# Patient Record
Sex: Female | Born: 1935 | Race: Black or African American | Hispanic: No | Smoking: Never smoker
Health system: Southern US, Community
[De-identification: ages and names within clinical notes are randomized; demographics above are authoritative.]

## PROBLEM LIST (undated history)

## (undated) DIAGNOSIS — K219 Gastro-esophageal reflux disease without esophagitis: Secondary | ICD-10-CM

## (undated) DIAGNOSIS — F329 Major depressive disorder, single episode, unspecified: Secondary | ICD-10-CM

## (undated) DIAGNOSIS — E079 Disorder of thyroid, unspecified: Secondary | ICD-10-CM

## (undated) DIAGNOSIS — I499 Cardiac arrhythmia, unspecified: Secondary | ICD-10-CM

## (undated) DIAGNOSIS — F32A Depression, unspecified: Secondary | ICD-10-CM

## (undated) DIAGNOSIS — M8580 Other specified disorders of bone density and structure, unspecified site: Secondary | ICD-10-CM

## (undated) HISTORY — PX: THYROIDECTOMY: SHX17

## (undated) HISTORY — DX: Major depressive disorder, single episode, unspecified: F32.9

## (undated) HISTORY — DX: Depression, unspecified: F32.A

## (undated) HISTORY — DX: Disorder of thyroid, unspecified: E07.9

## (undated) HISTORY — DX: Other specified disorders of bone density and structure, unspecified site: M85.80

## (undated) HISTORY — DX: Gastro-esophageal reflux disease without esophagitis: K21.9

## (undated) HISTORY — DX: Cardiac arrhythmia, unspecified: I49.9

## (undated) HISTORY — PX: OTHER SURGICAL HISTORY: SHX169

---

## 2000-11-02 ENCOUNTER — Ambulatory Visit (HOSPITAL_COMMUNITY): Admission: RE | Admit: 2000-11-02 | Discharge: 2000-11-02 | Payer: Self-pay | Admitting: Obstetrics and Gynecology

## 2000-11-02 ENCOUNTER — Encounter: Payer: Self-pay | Admitting: Internal Medicine

## 2004-03-24 ENCOUNTER — Inpatient Hospital Stay (HOSPITAL_COMMUNITY): Admission: RE | Admit: 2004-03-24 | Discharge: 2004-03-28 | Payer: Self-pay | Admitting: Orthopedic Surgery

## 2004-03-28 ENCOUNTER — Inpatient Hospital Stay (HOSPITAL_COMMUNITY)
Admission: RE | Admit: 2004-03-28 | Discharge: 2004-04-03 | Payer: Self-pay | Admitting: Physical Medicine & Rehabilitation

## 2004-06-25 ENCOUNTER — Inpatient Hospital Stay (HOSPITAL_COMMUNITY): Admission: RE | Admit: 2004-06-25 | Discharge: 2004-06-28 | Payer: Self-pay | Admitting: Orthopedic Surgery

## 2004-06-25 ENCOUNTER — Ambulatory Visit: Payer: Self-pay | Admitting: Physical Medicine & Rehabilitation

## 2004-06-28 ENCOUNTER — Inpatient Hospital Stay
Admission: RE | Admit: 2004-06-28 | Discharge: 2004-07-02 | Payer: Self-pay | Admitting: Physical Medicine & Rehabilitation

## 2004-06-28 ENCOUNTER — Ambulatory Visit: Payer: Self-pay | Admitting: Physical Medicine & Rehabilitation

## 2005-07-17 ENCOUNTER — Emergency Department (HOSPITAL_COMMUNITY): Admission: EM | Admit: 2005-07-17 | Discharge: 2005-07-17 | Payer: Self-pay | Admitting: Emergency Medicine

## 2006-04-05 ENCOUNTER — Ambulatory Visit (HOSPITAL_COMMUNITY): Admission: RE | Admit: 2006-04-05 | Discharge: 2006-04-05 | Payer: Self-pay | Admitting: Internal Medicine

## 2006-12-20 ENCOUNTER — Ambulatory Visit (HOSPITAL_COMMUNITY): Admission: RE | Admit: 2006-12-20 | Discharge: 2006-12-20 | Payer: Self-pay | Admitting: Internal Medicine

## 2007-07-30 ENCOUNTER — Emergency Department (HOSPITAL_COMMUNITY): Admission: EM | Admit: 2007-07-30 | Discharge: 2007-07-30 | Payer: Self-pay | Admitting: *Deleted

## 2007-09-15 ENCOUNTER — Ambulatory Visit (HOSPITAL_COMMUNITY): Admission: RE | Admit: 2007-09-15 | Discharge: 2007-09-15 | Payer: Self-pay | Admitting: Internal Medicine

## 2008-01-03 ENCOUNTER — Ambulatory Visit (HOSPITAL_COMMUNITY): Admission: RE | Admit: 2008-01-03 | Discharge: 2008-01-03 | Payer: Self-pay | Admitting: Internal Medicine

## 2008-01-19 ENCOUNTER — Encounter: Admission: RE | Admit: 2008-01-19 | Discharge: 2008-01-19 | Payer: Self-pay | Admitting: Gastroenterology

## 2010-09-21 ENCOUNTER — Encounter: Payer: Self-pay | Admitting: Internal Medicine

## 2010-12-23 ENCOUNTER — Other Ambulatory Visit: Payer: Self-pay | Admitting: Geriatric Medicine

## 2010-12-23 DIAGNOSIS — Z1231 Encounter for screening mammogram for malignant neoplasm of breast: Secondary | ICD-10-CM

## 2010-12-30 ENCOUNTER — Ambulatory Visit
Admission: RE | Admit: 2010-12-30 | Discharge: 2010-12-30 | Disposition: A | Discharge status: Home / Self Care | Payer: MEDICARE | Source: Ambulatory Visit | Attending: Geriatric Medicine | Admitting: Geriatric Medicine

## 2010-12-30 DIAGNOSIS — Z1231 Encounter for screening mammogram for malignant neoplasm of breast: Secondary | ICD-10-CM

## 2011-01-16 NOTE — Discharge Summary (Signed)
NAME:  Heather Ball, Heather Ball                      ACCOUNT NO.:  [id number]   MEDICAL RECORD NO.:  [id number]                   PATIENT TYPE:  IPS   LOCATION:  4033                                 FACILITY:  [facility]   PHYSICIAN:  Ellwood Dense, M.D.                DATE OF BIRTH:  March 07, 1932   DATE OF ADMISSION:  03/28/2004  DATE OF DISCHARGE:  04/03/2004                                 DISCHARGE SUMMARY   DISCHARGE DIAGNOSES:  1. Right total knee replacement.  2. Postoperative anemia.  3. Proteus urinary tract infection.   HISTORY OF PRESENT ILLNESS:  Heather Ball is a 77 year old female with a  history of GERD, hypothyroidism, end stage right knee, elected to undergo  right total knee replacement by Dr. Eulah Pont on March 24, 2004.  Post-op is  weightbearing as tolerated on Coumadin for DVT prophylaxis.  Chest x-ray  done showed some bronchitic changes and nebulizers were ordered.  The  patient on iron supplements for post-op anemia.  Physical therapy initiated  and patient is progressing along nicely.  She is independent supervision for  transfers, supervision for ambulating 80-130 feet.  __________  consulted  for progressive independence __________  .   PAST MEDICAL HISTORY:  1. Partial thyroidectomy.  2. Hypothyroidism.  3. Left knee scope.  4. Right knee DJD.  5. Bladder tack, 1988.  6. GERD.  7. Insomnia.  8. Bilateral adrenal enlargement.  9. Nocturia.   ALLERGIES:  PENICILLIN.   SOCIAL HISTORY:  The patient lives with her daughter in a one-level home  with three steps at entry, was independent with cane prior to admission.  She does not use any tobacco.  Smokes approximately one pack per day.   HOSPITAL COURSE:  Heather Ball was admitted to rehab, on March 28, 2004, for inpatient therapies to consist of PT OT daily.  Past admission she  was maintained on Coumadin for DVT prophylaxis.  Subcu heparin was continued  initially as INR was sub-therapeutic at 1.5.  Labs  done past admission  showed post-op anemia to be improving with a hemoglobin of 10.8, hematocrit  31.2, white count 6.2, platelets 420.  Sodium 137, potassium 3.9, chloride  103, CO2 28, BUN 20, creatinine 0.7, glucose of 112.  Alkaline phosphatase  slightly high at 92.  SGOT 51, SGPT 58.  The patient's UA/UC&S was checked  secondary to low-grade fevers, this grew out greater than 100,000 colonies  of Proteus.  The patient was started on Cipro for this and is to continue  this for seven total days of antibiotic therapy.  The patient's knee  incision has been healing well without any signs or symptoms of infection.  No drainage, no erythema, noted.  Staples remain intact and are to be D/C'd  on April 07, 2004, post-op day #15.  A home health nurse has been arranged  to draw pro times.  The Coumadin to continue through April 20, 2004.  The  patient has had issues with poor pain control initially.  She was started on  OxyContin CR with more consistent pain relief.  During her stay in rehab,  Heather Ball has progressed along well.  Currently, she is modified  independent for ADL knees, modified independent for toileting, modified  independent for kitchen activities.  She is at independent level for  transfers, modified independent for ambulating 150 feet with a rolling-  walker.  Knee flexion is currently at 72 degrees.  Further followup  therapies to include home health PT, OT by [facility] past discharge  on April 03, 2004.  The patient is discharged to home.   DISCHARGE MEDICATIONS:  1. Coumadin 3 mg two p.o. q.p.m.  2. Os-Cal plus D t.i.d.  3. Levothyroxine 100 mcg a day.  4. Trinsicon one p.o. b.i.d.  5. Senokot S two p.o. b.i.d.  6. OxyContin CR 10 mg q.12h. x 1 week, then one per day till gone.  7. OxyIR 5-10 mg q.4-6h. p.r.n. pain.  8. Robaxin 500 mg q.i.d. p.r.n. spasm.  9. Cipro 250 mg b.i.d.   PAIN MANAGEMENT:  May use Tylenol additionally for pain.   ACTIVITY:  Use  walker.   WOUND CARE:  Keep area clean and dry.   SPECIAL INSTRUCTIONS:  No alcohol, no smoking, no driving, no aspirin or  aspirin-containing products.   FOLLOW UP:  1. The patient to follow up with Dr. Eulah Pont and Dr. Chestine Spore in the next 2-3     weeks.  2. Follow up with Dr. Thomasena Edis as needed.      Greg Cutter, P.A.                    Ellwood Dense, M.D.    PP/MEDQ  D:  04/03/2004  T:  04/04/2004  Job:  [id number]   cc:   Chestine Spore, Dr.   Loreta Ave, M.D.  [address]  [place] [address]  Fax: [phone]

## 2011-01-16 NOTE — Op Note (Signed)
Heather Ball, Heather Ball            ACCOUNT NO.:  [id number]   MEDICAL RECORD NO.:  [id number]          PATIENT TYPE:  INP   LOCATION:  5017                         FACILITY:  [facility]   PHYSICIAN:  Loreta Ave, M.D. DATE OF BIRTH:  11-19-1931   DATE OF PROCEDURE:  06/25/2004  DATE OF DISCHARGE:                                 OPERATIVE REPORT   PREOPERATIVE DIAGNOSIS:  End-stage degenerative arthritis left knee with  marked arthrofibrosis and varus alignment and marked degenerative spurring.   POSTOPERATIVE DIAGNOSIS:  End-stage degenerative arthritis left knee with  marked arthrofibrosis and varus alignment and marked degenerative spurring.   PROCEDURE:  Left total knee replacement [employer] prosthesis.  Soft tissue  balancing.  Cemented #5 posterior stabilized femoral component.  Cemented #5  tibial tray with a 12 mm posterior stabilized Flex polyethylene insert.  Cemented recess 26 mm patella component.   SURGEON:  Loreta Ave, M.D.   ASSISTANT:  Cecil Cranker, PA.   ANESTHESIA:  General anesthesia.   ESTIMATED BLOOD LOSS:  Minimal.   TOURNIQUET TIME:  One hour and 30 minutes.   SPECIMENS:  Excised bone and soft tissue.   CULTURES:  None.   COMPLICATIONS:  None.   DRESSING:  Soft compressive.   DRAIN:  Hemovac x2.   DESCRIPTION OF PROCEDURE:  The patient was brought to the operating room and  after adequate anesthesia had been obtained, left knee examined.  Full  extension, flexion to about 70 degrees with an abrupt end point.  Varus  alignment barely correctable to neutral.  Tourniquet applied, prepped and  draped in usual sterile fashion.  Exsanguinated with elevation and esmarch.  Tourniquet inflated to 300 mmHg.  Straight incision above the patella down  to the tibial tubercle.  Medial parapatellar arthrotomy.  Extensive  spurring, remnants of menisci, cruciate ligaments all debrided.  Grade IV  changes throughout.  Distal femur exposed.  Medial  capsule release had been  completed with arthrotomy.  Intramedullary guide placed.  Distal cut  removing 10 mm  75 degrees of valgus.  Sized for #5 component.  Jigs put in  place.  Definitive cuts made.  Attention turned to the tibia.  Tibial spine  removed with saw.  Sized to #5 component.  Proximal cut removing 4 mm off  the deficient medial side with a 5 degree posterior slope cut.  All recess  examined and all spurs removed including the posterior aspect.  Patella was  then sized, reamed and drilled for a recess cemented 26 mm patellar  component.  Trials put in place, #5 on the femur and #5 on the tibia and a  12 mm polyethylene insert.  A 26 mm on the patella.  With appropriate soft  tissue balancing, I got full extension, full flexion, no lift off in flexion  and good patellofemoral tracking.  Tibia was marked for appropriate rotation  and then hand reamed.  All trials removed.  Copious irrigation with a pulsed  irrigating device.  Cement prepared, placed on all components which were  firmly seated, excessive cement removed.  Polyethylene inserted and the knee  reduced.  Once the cement hardened, the knee was reexamined.  Full  extension, full flexion, nicely balanced knee, 75 degrees of valgus.  Wound  irrigated.  Hemovacs placed and brought out through separate stab wound.  Arthrotomy closed with #1 Vicryl, skin and subcutaneous tissue with Vicryl  and then staples.  Knee injected with Marcaine and Hemovacs clamped.  Sterile compressive dressing applied.  Tourniquet deflated and removed.  Knee immobilizer applied.  Anesthesia reversed.  Brought to the recovery  room.  Tolerated surgery well with no complications.      Heather Ball   DFM/MEDQ  D:  06/25/2004  T:  06/25/2004  Job:  [id number]

## 2011-01-16 NOTE — Discharge Summary (Signed)
NAMEAMIEE, Heather Ball            ACCOUNT NO.:  [id number]   MEDICAL RECORD NO.:  [id number]          PATIENT TYPE:  ORB   LOCATION:  4524                         FACILITY:  [facility]   PHYSICIAN:  Ellwood Dense, M.D.   DATE OF BIRTH:  1932-02-14   DATE OF ADMISSION:  06/28/2004  DATE OF DISCHARGE:  07/02/2004                                 DISCHARGE SUMMARY   DISCHARGE DIAGNOSES:  1.  Left total knee replacement secondary to degenerative joint disease.      Pain management:  Coumadin for deep vein thrombosis prophylaxis.  2.  Hypothyroidism.  3.  Klebsiella gram-negative rod urinary tract infection.   HISTORY OF PRESENT ILLNESS:  A 77 year old female, history of a right total  knee replacement in July of 2005 for which she received inpatient rehab  services for,  now admitted on June 25, 2004, with end-stage changes to  the left knee, no relief with conservative care.  Underwent a left total  knee replacement on October 26 per Dr. Eulah Pont.  Placed on Coumadin for deep  vein thrombosis prophylaxis.  Weightbearing as tolerated.  Hemovac removed  October 28.  Mild hypokalemia, 3.4.  Supplement added.  Admitted for a  comprehensive rehab program.   PAST MEDICAL HISTORY:  See discharge diagnoses.  Positive tobacco.  No  alcohol.   ALLERGIES:  PENICILLIN.   MEDICATIONS:  Prior to admission were Synthroid and a multivitamin.   SOCIAL HISTORY:  Lives with daughter and son in [place] in a one-level  home, three steps to entry.  Daughter works day shift.  Son can assist on a  limited basis.   HOSPITAL COURSE:  Patient with progressive gains while on Rehab Services  with therapies initiated daily.  The falling issues are followed on  patient's rehab course.  Pertaining to Mrs. Nilson's left total knee  replacement, surgical site healing nice.  No signs of infection.  She would  follow up with Dr. Eulah Pont for removal of clips.  She was ambulating  household functional distances  with a walker, weightbearing as tolerated.  She continued on Vicodin as needed for pain.  Coumadin for deep vein  thrombosis prophylaxis with latest INR of 2.5.  Venous Doppler studies  negative for deep vein thrombosis.  She would be [facility] by [facility].  She remained on her hormone supplement for hypothyroidism.  She was treated with Macrobid for a Gram-negative rod Klebsiella urinary  tract infection as she was allergic to penicillin drugs.  Cipro was  considered but would need close monitoring of INR while on Coumadin.   Overall, for her functional status she was ambulating household distances,  exhibited no unsafe behavior.  Her son would be able to provide intermittent  supervision that she would need.  She was discharged to home.   Latest labs showed a hemoglobin of 11.2, hematocrit 32, sodium 136,  potassium 3.4, BUN 3, creatinine 0.6.   DISCHARGE MEDICATIONS:  At the time of discharge included:  1.  Coumadin, latest dose of 3 mg to be completed November 26.  2.  Macrobid one p.o. b.i.d. x 5  days.  3.  Synthroid 100 mcg daily.  4.  Vicodin as needed for pain.   Activity as tolerated.  Diet was regular. Wound care:  Follow up with Dr.  Eulah Pont in one week for removal of staples.  Home health nurse per [facility] to complete Coumadin protocol.  She should follow Dr.  Margaretmary Bayley, medical management.       DA/MEDQ  D:  07/01/2004  T:  07/01/2004  Job:  [id number]   cc:   Loreta Ave, M.D.  [address]  [place] [address]  Fax: [phone]   Margaretmary Bayley, M.D.  [address]  [place]  [place] [address]  Fax: [phone]

## 2011-01-16 NOTE — Op Note (Signed)
NAME:  EMA, HEBNER                      ACCOUNT NO.:  [id number]   MEDICAL RECORD NO.:  [id number]                   PATIENT TYPE:  INP   LOCATION:  NA                                   FACILITY:  [facility]   PHYSICIAN:  Loreta Ave, M.D.              DATE OF BIRTH:  September 26, 1931   DATE OF PROCEDURE:  03/24/2004  DATE OF DISCHARGE:                                 OPERATIVE REPORT   PREOPERATIVE DIAGNOSES:  End-stage degenerative arthritis, right knee, with  marked varus alignment and flexion contracture and bony destruction of  medial compartment.   POSTOPERATIVE DIAGNOSES:  End-stage degenerative arthritis, right knee, with  marked varus alignment and flexion contracture and bony destruction of  medial compartment.   OPERATION:  1. Right total knee replacement with soft tissue balancing including medial     capsular release.  2. Removal of extensive spurs and loose bodies medial.  Cemented posterior     stabilized, #5 femoral component, cemented #5 tibial component, a 12 mm     posterior stabilized Flex insert, a 26 mm recessed cemented patellar     component.   SURGEON:  Loreta Ave, M.D.   ASSISTANT:  Arlys John D. Petrarca, P.A.-C.   ANESTHESIA:  General.   ESTIMATED BLOOD LOSS:  Minimal.   TOURNIQUET TIME:  One hour and 30 minutes.   SPECIMENS:  Bone and soft tissue.   CULTURES:  None.   COMPLICATIONS:  None.   DRESSING:  Soft compressive with a knee immobilizer.   DRAIN:  Hemovac x2.   DESCRIPTION OF PROCEDURE:  The patient is brought to the operating room and  placed on the operating room table in the supine position.  After adequate  anesthesia had been obtained, the right knee was examined.  More than 10  degrees of varus, not very correctable, and more than 10-degree flexion  contracture.  Further flexion to about 90 degrees.  Pseudo-laxity from  medial destructive changes.  The tourniquet was applied.  Prepped and draped  in the usual sterile  fashion.  Exsanguinated with the elevation of the  Esmarch.  The tourniquet inflated to 350 mmHg.  A straight incision above  the patella down to the tibial tubercle.  Medial parapatellar arthrotomy.  Extensive dissection medially with a capsular release, and removal of all of  the destructive bone and osteophytes and loose bodies, medial aspect as well  as posteriorly.  Generous medial capsular release.  Grade 4 changes  throughout.  The tibia exposed.  Intramedullary guide placed for femoral  resection.  Distal femur cut removing 10 mm, set at 5 degrees of valgus.  All spurs removed from the posterior aspect.  Finishing cuts were then made  for the #5 cemented component.  Attention turned to the tibia.  The proximal end was cut with a saw to try  to get perpendicular to the shaft, because of marked varus.  The  intramedullary guide  was placed.  I then made a proximal cut referencing off  the defect medially with a 0-degree cut, resecting about 6 or 7 mm off the  lateral side.  Once I was down to a good level of cut and removed all the  spurs on the medial side, getting back to the native tibia, I actually had a  reasonable bone throughout, and sized for a #5 component.  Some sclerotic  bone was left medially that was drilled for linear cement penetration.  The  patella was then sized, reamed and drilled for the 26 mm component.  At  completion, all recesses examined.  All loose bodies were removed.  A  thorough irrigation.  Trials were put in place, a #5 on the femur, and #5 on  the tibia, and a 26 mm on the patella. With the 12 mm Flex insert, I had  full flexion and full flexion, with a nicely balanced knee with good  stability in flexion and extension, and lift-off in flexion.  The tibia was  marked for appropriate rotation and reamed.  All trials were removed.  Copious irrigation with the pulse irrigating device.  Cement prepared and  placed on all components which were then firmly  seated and hammered into  place.  Excessive cement was removed.  Polyethylene attached to the tibia.  The knee was reduced.  Once the cement hardened, the knee was examined.  Full extension and full flexion, good stability in flexion and extension.  Good patellofemoral tracking.  Hemovacs were placed and brought out through  a separate stab wound.  The arthrotomy was closed with #1 Vicryl.  The skin  and subcutaneous tissue closed with Vicryl and staples.  The margins of the  wounds in the knee were injected with Marcaine.  A sterile compressive  dressing applied.  The tourniquet was deflated and removed.  The knee  immobilizer applied.  Anesthesia was reversed.  The patient was brought to the recovery room.  She tolerated the surgery  well with no complications.                                               Loreta Ave, M.D.    DFM/MEDQ  D:  03/24/2004  T:  03/24/2004  Job:  [id number]

## 2011-01-16 NOTE — Discharge Summary (Signed)
NAME:  Heather Ball, Heather Ball                      ACCOUNT NO.:  [id number]   MEDICAL RECORD NO.:  [id number]                   PATIENT TYPE:  INP   LOCATION:  5013                                 FACILITY:  [facility]   PHYSICIAN:  Loreta Ave, M.D.              DATE OF BIRTH:  1931/12/19   DATE OF ADMISSION:  03/24/2004  DATE OF DISCHARGE:  03/28/2004                                 DISCHARGE SUMMARY   ADMISSION DIAGNOSIS:  Advanced degenerative joint disease of the right knee.   DISCHARGE DIAGNOSES:  1. Advanced degenerative joint disease of the right knee.  2. Hypothyroidism.  3. Cigarette smoker.  4. Esophageal reflux.  5. Hypokalemia.   PROCEDURE:  Right total knee replacement.   HISTORY OF PRESENT ILLNESS:  A 77 year old black female status post knee  arthroscopy bilateral in 1989.  She, at that time, was told that she had  degenerative joint disease.  However, the last two years has worsened to the  point she was having difficulty with pain at rest as well as activities of  daily living.  Indicated for right total knee replacement.   HOSPITAL COURSE:  A 77 year old black female admitted March 24, 2004, after  appropriate laboratory studies were obtained as well as 1 g of vancomycin IV  on call to the operating room.  She was taken to the operating room where  she underwent a right total knee replacement.  She tolerated the procedure  well.  She was placed on a Dilaudid high dose hydromorphone PCA pump and  vancomycin IV 1 g q.12h. x2 doses was continued.  Heparin 5000 units  subcutaneous q.12h. until her Coumadin became therapeutic.  Foley was placed  intraoperatively.  Consultations with PT, OT and rehab were made.  Ambulating weightbearing as tolerated on the right.  Total knee precautions  were used.  Margaretmary Bayley, M.D., was consulted for medical care.  Hemoglobin  A1C was ordered.  She was allowed out of bed to a chair the following day.  The respiratory therapy was  ordered for albuterol/Atrovent nebulizers q.i.d.  Mucinex 600 mg p.o. b.i.d.  Decadron 1 mg at 11 p.m. on March 25, 2004.  An  a.m.  cortisol, CMET, CBC, and lipid panel was ordered by Dr. Chestine Spore.  She  became hypokalemic and she was placed on KCl 20 mEq b.i.d. x1 day.  This did  help improve her hypokalemia.  Once she was stable, she was then transferred  to rehab on March 28, 2004, where she will continue with her physical therapy  and occupational therapy.  Discharged in improved condition.   EKG reveals normal sinus rhythm with frequent premature ectopic complexes.  Minimal voltage criteria for LVH may be normal variant.  Nonspecific T-wave  abnormality.   RADIOGRAPHIC STUDIES:  Right knee on March 24, 2004, revealed findings  suggesting satisfactory total knee replacement on the right with surgical  drains.   LABORATORY DATA:  Hemoglobin 14.0, hematocrit 40.4%, white count 7000,  platelet count 273,000.  Discharge hemoglobin 10.3, hematocrit 29.6%, white  count 8400, platelets 301,000.  Discharge protime was 15.8 with INR of 1.4.  Preoperative sodium 141, potassium 3.8, chloride 108, CO2 28, glucose 135,  BUN 22, creatinine 0.8, calcium 9.1, total protein 7.2, albumin 3.9, AST 19,  ALT 19, ALP 79 and total bilirubin 0.6.  Discharge sodium 139, potassium  3.8, chloride 102, CO2 30, glucose 116, BUN 12, creatinine 0.7, calcium 9.1.  Glycosylated  March 25, 2004, was 5.9.  Lipid profile revealed a cholesterol  of 202 with a triglycerides of 124.  HDL was 74, ratio 2.7, VLDL cholesterol  was 25, LDL 103.  Risk factor was less than one half average risk.  TSH was  0.745.  T3 was 101. ACTH of March 18, 2004, was 11.  Cortisol of March 18, 2004, was 13.7.  Cortisol of March 26, 2004, was 6.0.  Urinalysis March 18, 2004, was benign for voided urine.  Blood type was B positive, antibody  screen negative.   DISCHARGE INSTRUCTIONS:  She was transferred to rehab where she will  continue with her  physical and occupational therapy.   CONDITION ON DISCHARGE:  Improved.      Oris Drone Petrarca, P.A.-C.                Loreta Ave, M.D.    BDP/MEDQ  D:  04/21/2004  T:  04/22/2004  Job:  [id number]

## 2011-06-09 LAB — DIFFERENTIAL
Eosinophils Relative: 1
Lymphocytes Relative: 30
Lymphs Abs: 2.2
Neutro Abs: 4.3
Neutrophils Relative %: 60

## 2011-06-09 LAB — COMPREHENSIVE METABOLIC PANEL
ALT: 25
AST: 24
Albumin: 3.9
Alkaline Phosphatase: 80
BUN: 10
CO2: 29
Calcium: 9.3
GFR calc Af Amer: >60
Glucose, Bld: 120 — ABNORMAL HIGH
Potassium: 4.2

## 2011-06-09 LAB — POCT CARDIAC MARKERS: Operator id: 4074

## 2011-06-09 LAB — CBC
MCHC: 34.7
Platelets: 266
RDW: 13.2
WBC: 7.2

## 2011-06-09 LAB — LIPASE, BLOOD: Lipase: 18

## 2014-09-20 ENCOUNTER — Encounter: Payer: Self-pay | Admitting: *Deleted

## 2018-05-27 ENCOUNTER — Other Ambulatory Visit: Payer: Self-pay | Admitting: Geriatric Medicine

## 2018-05-27 DIAGNOSIS — R1904 Left lower quadrant abdominal swelling, mass and lump: Secondary | ICD-10-CM

## 2018-06-02 ENCOUNTER — Other Ambulatory Visit: Payer: Self-pay

## 2018-06-06 ENCOUNTER — Inpatient Hospital Stay
Admission: RE | Admit: 2018-06-06 | Discharge: 2018-06-06 | Disposition: A | Discharge status: Home / Self Care | Payer: Self-pay | Source: Ambulatory Visit | Attending: Geriatric Medicine | Admitting: Geriatric Medicine

## 2018-06-14 ENCOUNTER — Ambulatory Visit
Admission: RE | Admit: 2018-06-14 | Discharge: 2018-06-14 | Disposition: A | Discharge status: Home / Self Care | Payer: Medicare Other | Source: Ambulatory Visit | Attending: Geriatric Medicine | Admitting: Geriatric Medicine

## 2018-06-14 DIAGNOSIS — R1904 Left lower quadrant abdominal swelling, mass and lump: Secondary | ICD-10-CM

## 2018-06-14 MED ORDER — IOPAMIDOL (ISOVUE-300) INJECTION 61%
100.0000 mL | Freq: Once | INTRAVENOUS | Status: AC | PRN
  Administered 2018-06-14: 100 mL via INTRAVENOUS

## 2020-07-14 ENCOUNTER — Ambulatory Visit: Payer: Medicare Other | Attending: Internal Medicine

## 2020-07-14 DIAGNOSIS — Z23 Encounter for immunization: Secondary | ICD-10-CM

## 2020-07-14 NOTE — Progress Notes (Signed)
   Covid-19 Vaccination Clinic  Name:  Heather Ball    MRN: [id number] DOB: 01-12-1932  07/14/2020  [name] was observed post Covid-19 immunization for 15 minutes without incident. She was provided with Vaccine Information Sheet and instruction to access the V-Safe system.   [name] was instructed to call 911 with any severe reactions post vaccine: Marland Kitchen Difficulty breathing  . Swelling of face and throat  . A fast heartbeat  . A bad rash all over body  . Dizziness and weakness   Immunizations Administered    Name Date Dose VIS Date Route   Pfizer COVID-19 Vaccine 07/14/2020 12:03 PM 0.3 mL 06/19/2020 Intramuscular   Manufacturer: [employer], [employer]   Lot: [id number]   NDC: [address]-5

## 2020-10-14 DIAGNOSIS — R413 Other amnesia: Secondary | ICD-10-CM | POA: Diagnosis not present

## 2020-10-14 DIAGNOSIS — Z79899 Other long term (current) drug therapy: Secondary | ICD-10-CM | POA: Diagnosis not present

## 2020-10-14 DIAGNOSIS — R7303 Prediabetes: Secondary | ICD-10-CM | POA: Diagnosis not present

## 2020-10-14 DIAGNOSIS — I700 Atherosclerosis of aorta: Secondary | ICD-10-CM | POA: Diagnosis not present

## 2021-02-24 DIAGNOSIS — Z03818 Encounter for observation for suspected exposure to other biological agents ruled out: Secondary | ICD-10-CM | POA: Diagnosis not present

## 2021-03-04 DIAGNOSIS — U071 COVID-19: Secondary | ICD-10-CM | POA: Diagnosis not present

## 2021-03-06 DIAGNOSIS — U071 COVID-19: Secondary | ICD-10-CM | POA: Diagnosis not present

## 2021-03-13 DIAGNOSIS — Z03818 Encounter for observation for suspected exposure to other biological agents ruled out: Secondary | ICD-10-CM | POA: Diagnosis not present

## 2021-03-13 DIAGNOSIS — Z20822 Contact with and (suspected) exposure to covid-19: Secondary | ICD-10-CM | POA: Diagnosis not present

## 2021-04-11 ENCOUNTER — Other Ambulatory Visit: Payer: Self-pay | Admitting: Geriatric Medicine

## 2021-04-11 ENCOUNTER — Other Ambulatory Visit (HOSPITAL_COMMUNITY): Payer: Self-pay | Admitting: Geriatric Medicine

## 2021-04-11 DIAGNOSIS — R29898 Other symptoms and signs involving the musculoskeletal system: Secondary | ICD-10-CM

## 2021-04-11 DIAGNOSIS — Z79899 Other long term (current) drug therapy: Secondary | ICD-10-CM | POA: Diagnosis not present

## 2021-04-11 DIAGNOSIS — K5901 Slow transit constipation: Secondary | ICD-10-CM | POA: Diagnosis not present

## 2021-04-19 ENCOUNTER — Emergency Department (HOSPITAL_COMMUNITY)
Admission: EM | Admit: 2021-04-19 | Discharge: 2021-04-19 | Discharge status: Absent without leave | Payer: Medicare Other | Source: Home / Self Care

## 2021-04-19 ENCOUNTER — Other Ambulatory Visit (HOSPITAL_COMMUNITY): Payer: Self-pay | Admitting: Geriatric Medicine

## 2021-04-19 ENCOUNTER — Ambulatory Visit (HOSPITAL_COMMUNITY)
Admission: EM | Admit: 2021-04-19 | Discharge: 2021-04-19 | Disposition: A | Discharge status: Home / Self Care | Payer: Medicare Other | Attending: Geriatric Medicine | Admitting: Geriatric Medicine

## 2021-04-19 DIAGNOSIS — R29898 Other symptoms and signs involving the musculoskeletal system: Secondary | ICD-10-CM

## 2021-04-19 DIAGNOSIS — M48061 Spinal stenosis, lumbar region without neurogenic claudication: Secondary | ICD-10-CM | POA: Diagnosis not present

## 2021-04-19 DIAGNOSIS — M4316 Spondylolisthesis, lumbar region: Secondary | ICD-10-CM | POA: Diagnosis not present

## 2021-04-19 DIAGNOSIS — M5126 Other intervertebral disc displacement, lumbar region: Secondary | ICD-10-CM | POA: Diagnosis not present

## 2021-04-19 DIAGNOSIS — R531 Weakness: Secondary | ICD-10-CM | POA: Diagnosis not present

## 2021-04-25 ENCOUNTER — Encounter: Payer: Self-pay | Admitting: Neurology

## 2021-05-02 DIAGNOSIS — M25561 Pain in right knee: Secondary | ICD-10-CM | POA: Diagnosis not present

## 2021-05-02 DIAGNOSIS — M25562 Pain in left knee: Secondary | ICD-10-CM | POA: Diagnosis not present

## 2021-05-16 ENCOUNTER — Emergency Department (HOSPITAL_COMMUNITY)
Admission: EM | Admit: 2021-05-16 | Discharge: 2021-05-31 | Disposition: E | Payer: Medicare Other | Attending: Emergency Medicine | Admitting: Emergency Medicine

## 2021-05-16 DIAGNOSIS — Z7982 Long term (current) use of aspirin: Secondary | ICD-10-CM | POA: Insufficient documentation

## 2021-05-16 DIAGNOSIS — I469 Cardiac arrest, cause unspecified: Secondary | ICD-10-CM | POA: Insufficient documentation

## 2021-05-16 DIAGNOSIS — R4020 Unspecified coma: Secondary | ICD-10-CM | POA: Diagnosis not present

## 2021-05-16 DIAGNOSIS — Z743 Need for continuous supervision: Secondary | ICD-10-CM | POA: Diagnosis not present

## 2021-05-16 DIAGNOSIS — I499 Cardiac arrhythmia, unspecified: Secondary | ICD-10-CM | POA: Diagnosis not present

## 2021-05-16 DIAGNOSIS — Z96659 Presence of unspecified artificial knee joint: Secondary | ICD-10-CM | POA: Diagnosis not present

## 2021-05-16 DIAGNOSIS — R404 Transient alteration of awareness: Secondary | ICD-10-CM | POA: Diagnosis not present

## 2021-05-16 LAB — I-STAT VENOUS BLOOD GAS, ED
Acid-base deficit: 27 mmol/L — ABNORMAL HIGH (ref 0.0–2.0)
Bicarbonate: 9.9 mmol/L — ABNORMAL LOW (ref 20.0–28.0)
Calcium, Ion: 0.97 mmol/L — ABNORMAL LOW (ref 1.15–1.40)
HCT: 37 % (ref 36.0–46.0)
Hemoglobin: 12.6 g/dL (ref 12.0–15.0)
O2 Saturation: 48 %
Potassium: 4.9 mmol/L (ref 3.5–5.1)
Sodium: 139 mmol/L (ref 135–145)
TCO2: 12 mmol/L — ABNORMAL LOW (ref 22–32)
pCO2, Ven: 77.1 mmHg (ref 44.0–60.0)
pH, Ven: 6.715 — CL (ref 7.250–7.430)
pO2, Ven: 53 mmHg — ABNORMAL HIGH (ref 32.0–45.0)

## 2021-05-16 LAB — I-STAT CHEM 8, ED
BUN: 38 mg/dL — ABNORMAL HIGH (ref 8–23)
Calcium, Ion: 0.99 mmol/L — ABNORMAL LOW (ref 1.15–1.40)
Chloride: 106 mmol/L (ref 98–111)
Creatinine, Ser: 1.1 mg/dL — ABNORMAL HIGH (ref 0.44–1.00)
Glucose, Bld: 385 mg/dL — ABNORMAL HIGH (ref 70–99)
HCT: 37 % (ref 36.0–46.0)
Hemoglobin: 12.6 g/dL (ref 12.0–15.0)
Potassium: 4.8 mmol/L (ref 3.5–5.1)
Sodium: 139 mmol/L (ref 135–145)
TCO2: 15 mmol/L — ABNORMAL LOW (ref 22–32)

## 2021-05-16 MED ORDER — EPINEPHRINE 1 MG/10ML IJ SOSY
PREFILLED_SYRINGE | INTRAMUSCULAR | Status: DC | PRN
  Administered 2021-05-16: 1 mg via INTRAVENOUS

## 2021-05-16 MED ORDER — EPINEPHRINE 1 MG/10ML IJ SOSY
PREFILLED_SYRINGE | INTRAMUSCULAR | Status: DC | PRN
  Administered 2021-05-16 (×4): 1 mg via INTRAVENOUS

## 2021-05-16 MED ORDER — AMIODARONE HCL 150 MG/3ML IV SOLN
INTRAVENOUS | Status: DC | PRN
  Administered 2021-05-16: 300 mg via INTRAVENOUS

## 2021-05-16 MED ORDER — SODIUM BICARBONATE 8.4 % IV SOLN
INTRAVENOUS | Status: DC | PRN
  Administered 2021-05-16: 50 meq via INTRAVENOUS

## 2021-05-31 NOTE — Code Documentation (Signed)
Pulse check, no pulse, asystole

## 2021-05-31 NOTE — Code Documentation (Signed)
Pulse check, advise shock

## 2021-05-31 NOTE — Code Documentation (Signed)
Patient time of death occurred at 15:49.

## 2021-05-31 NOTE — Code Documentation (Signed)
Pulse check, no pulse V-fib, advised shock.

## 2021-05-31 NOTE — ED Notes (Signed)
Pt placement notified. Post mortem checklist is complete and once family visits with pt, pt can be taken to the morgue.

## 2021-05-31 NOTE — Code Documentation (Signed)
Compressions resumed

## 2021-05-31 NOTE — ED Notes (Signed)
Pt BIB [facility] from doctors office witnessed collapse with CPR in progress. Pt was waiting to be seen at the doctors office. Per family pt had been c/o of not feelings well for a couple days. While waiting to be seen pt collapsed into the floor, no pulse. Staff began CPR within 30 secs. EMS arrived, 5 epis given. Pt remained asystole the entire time. CPR with EMS lasted 15-20 mins.

## 2021-05-31 NOTE — Code Documentation (Signed)
Pulse check, no pulse, V-fib rhythm shock advised.

## 2021-05-31 NOTE — ED Provider Notes (Signed)
[facility] EMERGENCY DEPARTMENT Provider Note   CSN: [id number] Arrival date & time: 2021-05-28  1533     History No chief complaint on file.   Nykayla ALYSSAMAE KLINCK is a 87 y.o. female.  87 year old female with prior medical history as detailed below presents in cardiac arrest.   Patient was with family at Dr. Laverle Hobby office.  She had complained of not feeling well.  While waiting for evaluation she collapsed.  Bystander CPR was initiated immediately.  EMS reports initial rhythm was asystole.  No shocks administered prior to arrival.  Patient with 5 epi is given prior to arrival.  The history is provided by the EMS personnel.  Cardiac Arrest Witnessed by:  Family member and healthcare provider Incident location: office. Time since incident:  20 minutes Time before BLS initiated:  Immediate Time before ALS initiated:  Immediate Condition upon EMS arrival:  Unresponsive Pulse:  Absent Initial cardiac rhythm per EMS:  Asystole Treatments prior to arrival:  ACLS protocol Medications given prior to ED:  Epinephrine Rhythm on admission to ED:  Asystole Associated symptoms: weakness       Past Medical History:  Diagnosis Date   Arrhythmia    Depression    GERD (gastroesophageal reflux disease)    Osteopenia    Thyroid disease     There are no problems to display for this patient.   Past Surgical History:  Procedure Laterality Date   THYROIDECTOMY     TOTAL KNEE REPLACEMMENT       OB History   No obstetric history on file.     Family History  Problem Relation Age of Onset   Diabetes Mother    Cancer Father    Cancer Sister    Cancer Brother    Cancer Sister    Diabetes Sister    Cancer Brother     Social History   Tobacco Use   Smoking status: Never  Substance Use Topics   Alcohol use: No   Drug use: No    Home Medications Prior to Admission medications   Medication Sig Start Date End Date Taking? Authorizing Provider   alendronate (FOSAMAX) 70 MG tablet Take 70 mg by mouth once a week. Take with a full glass of water on an empty stomach.    [provider]  aspirin 81 MG chewable tablet Chew by mouth daily.    [provider]  cholecalciferol (VITAMIN D) 1000 UNITS tablet Take 1,000 Units by mouth daily.    [provider]  levothyroxine (SYNTHROID, LEVOTHROID) 25 MCG tablet Take 25 mcg by mouth daily before breakfast.    [provider]  Melatonin 3 MG CAPS Take by mouth as needed. FOR SLEEP    [provider]  omeprazole (PRILOSEC) 20 MG capsule Take 20 mg by mouth daily.    [provider]    Allergies    Penicillins  Review of Systems   Review of Systems  Neurological:  Positive for weakness.  All other systems reviewed and are negative.  Physical Exam Updated Vital Signs There were no vitals taken for this visit.  Physical Exam Vitals and nursing note reviewed.  Constitutional:      General: She is not in acute distress.    Appearance: She is well-developed.     Comments: In cardiac arrest  CPR  Unresponsive  Asystole on monitor  ET tube in place with good ventilations bilaterally  HENT:     Head: Normocephalic and atraumatic.  Eyes:     Conjunctiva/sclera: Conjunctivae normal.  Pulmonary:     Effort: No respiratory distress.  Abdominal:     General: There is no distension.     Palpations: Abdomen is soft.     Tenderness: There is no abdominal tenderness.  Musculoskeletal:        General: No deformity.     Cervical back: Normal range of motion and neck supple.  Skin:    General: Skin is warm and dry.    ED Results / Procedures / Treatments   Labs (all labs ordered are listed, but only abnormal results are displayed) Labs Reviewed  I-STAT CHEM 8, ED - Abnormal; Notable for the following components:      Result Value   BUN 38 (*)    Creatinine, Ser 1.10 (*)    Glucose, Bld 385 (*)    Calcium, Ion 0.99 (*)    TCO2  15 (*)    All other components within normal limits  I-STAT VENOUS BLOOD GAS, ED - Abnormal; Notable for the following components:   pH, Ven 6.715 (*)    pCO2, Ven 77.1 (*)    pO2, Ven 53.0 (*)    Bicarbonate 9.9 (*)    TCO2 12 (*)    Acid-base deficit 27.0 (*)    Calcium, Ion 0.97 (*)    All other components within normal limits    EKG None  Radiology No results found.  Procedures Procedures   Medications Ordered in ED Medications  EPINEPHrine (ADRENALIN) 1 MG/10ML injection (1 mg Intravenous Given Jun 05, 2021 1532)    ED Course  I have reviewed the triage vital signs and the nursing notes.  Pertinent labs & imaging results that were available during my care of the patient were reviewed by me and considered in my medical decision making (see chart for details).    MDM Rules/Calculators/A&P                           CRITICAL CARE Performed by: Wynetta Fines   Total critical care time: 30 minutes  Critical care time was exclusive of separately billable procedures and treating other patients.  Critical care was necessary to treat or prevent imminent or life-threatening deterioration.  Critical care was time spent personally by me on the following activities: development of treatment plan with patient and/or surrogate as well as nursing, discussions with consultants, evaluation of patient's response to treatment, examination of patient, obtaining history from patient or surrogate, ordering and performing treatments and interventions, ordering and review of laboratory studies, ordering and review of radiographic studies, pulse oximetry and re-evaluation of patient's condition.     MDM  MSE complete  Cory WENDELIN READER was evaluated in Emergency Department on June 05, 2021 for the symptoms described in the history of present illness. She was evaluated in the context of the global COVID-19 pandemic, which necessitated consideration that the patient might be at risk for  infection with the SARS-CoV-2 virus that causes COVID-19. Institutional protocols and algorithms that pertain to the evaluation of patients at risk for COVID-19 are in a state of rapid change based on information released by regulatory bodies including the CDC and federal and state organizations. These policies and algorithms were followed during the patient's care in the ED.   Patient presents from Dr. Laverle Hobby office after collapse.  Patient was found to be in cardiac arrest.  EMS initiated resuscitation attempt PTA.  Patient with asystole prior to hospital arrival.  Patient with continued asystole and brief intermittent V. fib during resuscitation attempt.  Resuscitation attempt was unsuccessful.  Dr. Laverle Hobby office is aware of the outcome of the case.  Death Certificate is to be signed by Dr. Pete Glatter.  Time of death 01[birth year].  Family understands outcome of case.  All questions answered.     Final Clinical Impression(s) / ED Diagnoses Final diagnoses:  Cardiac arrest [facility])    Rx / DC Orders ED Discharge Orders     None        Wynetta Fines, MD 2021/06/10 [phone]

## 2021-05-31 NOTE — Code Documentation (Signed)
Pulse Check, Asystole, no pulse

## 2021-05-31 NOTE — Code Documentation (Signed)
Central Line attempt by Dr. Rodena Medin.

## 2021-05-31 NOTE — ED Notes (Signed)
Daughter gave funeral home information as she was walking out. RN notified.   [place] [facility] [phone]  Daughter Shallon Yaklin  [phone]

## 2021-05-31 DEATH — deceased

## 2021-07-04 ENCOUNTER — Ambulatory Visit: Payer: Medicare Other | Admitting: Neurology

## 2022-06-23 IMAGING — MR MR LUMBAR SPINE W/O CM
4 of 5 series · 26 of 48 positions shown · non-contrast
Comparison: None.

CLINICAL DATA: Bilateral leg weakness for 2 weeks

EXAM:
MRI LUMBAR SPINE WITHOUT CONTRAST
TECHNIQUE: Multiplanar, multisequence MR imaging of the lumbar spine was
performed. No intravenous contrast was administered.

[Series 9: T2 · sagittal · 4.0mm · 0.73mm/px · 6 of 16 slices shown (1 of 2)]
[im 1/16]
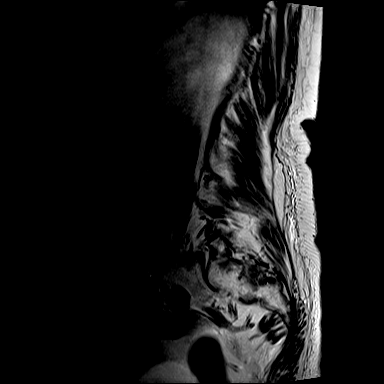
[im 4/16]
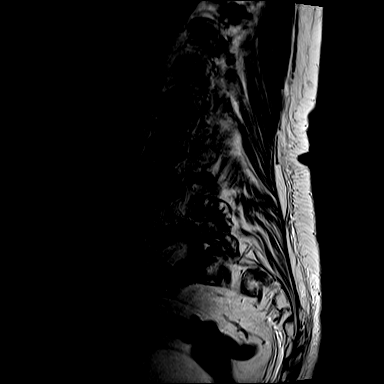
[im 7/16]
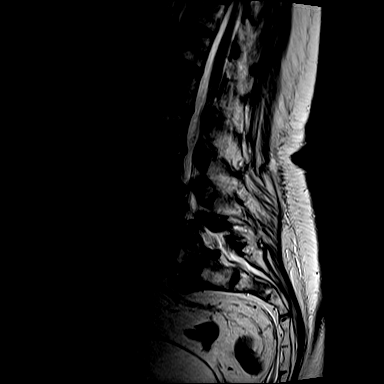
[im 10/16]
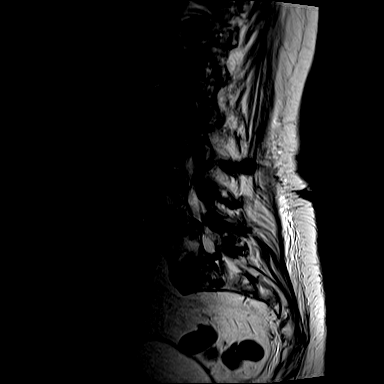
[im 13/16]
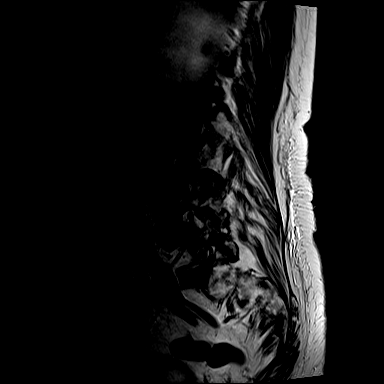
[im 16/16]
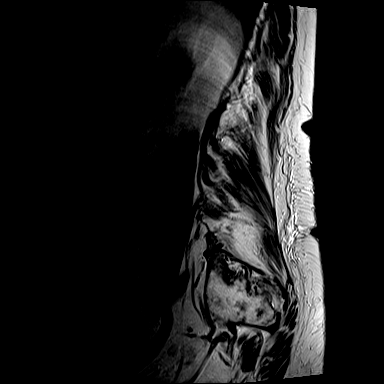

[Series 11: T1 · sagittal · 4.0mm · 0.88mm/px · 7 of 16 slices shown (1 of 2)]
[im 1/16]
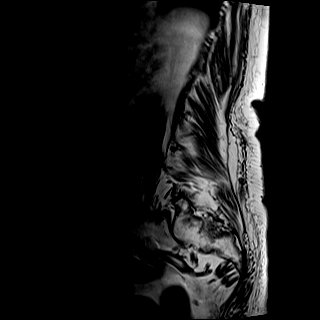
[im 3/16]
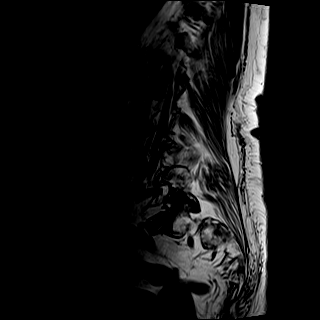
[im 6/16]
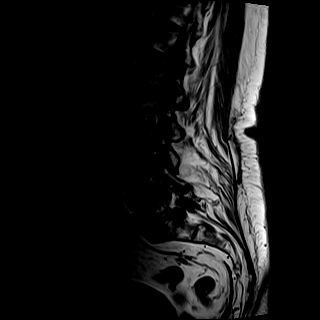
[im 8/16]
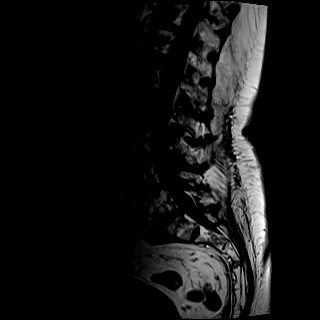
[im 11/16]
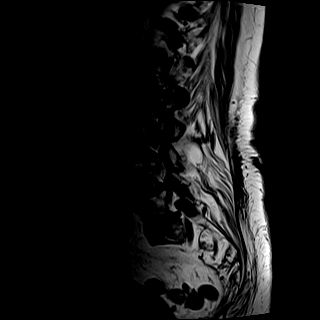
[im 13/16]
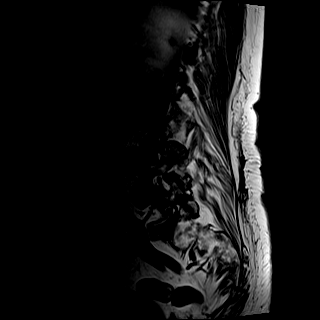
[im 16/16]
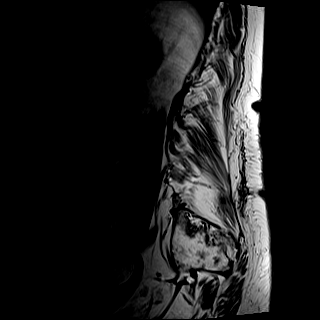

[Series 12: T2 · axial · 4.0mm · 0.57mm/px · z∈[+6,+204]mm · 8 of 31 slices shown (2 of 2)]
[im 1/31]
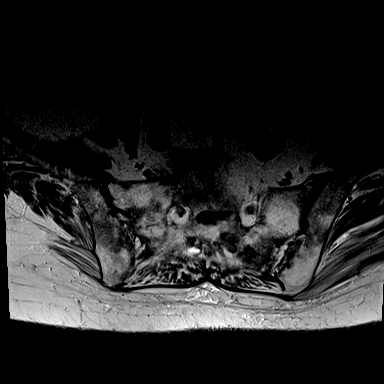
[im 5/31]
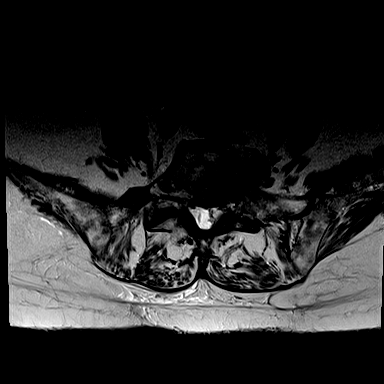
[im 10/31]
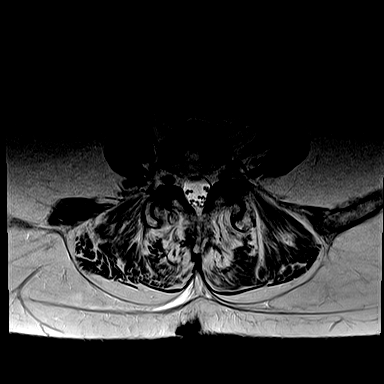
[im 14/31]
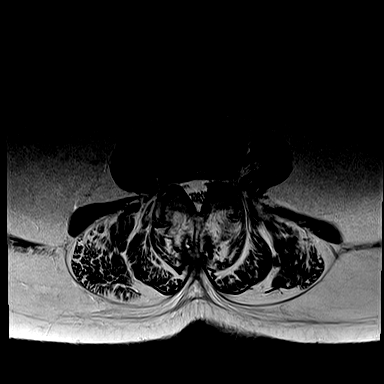
[im 17/31]
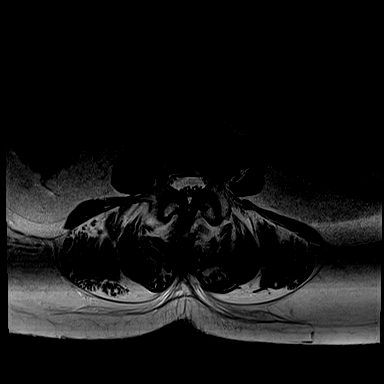
[im 21/31]
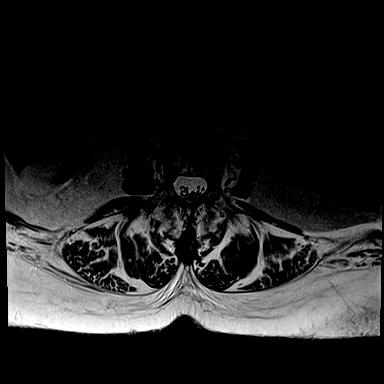
[im 26/31]
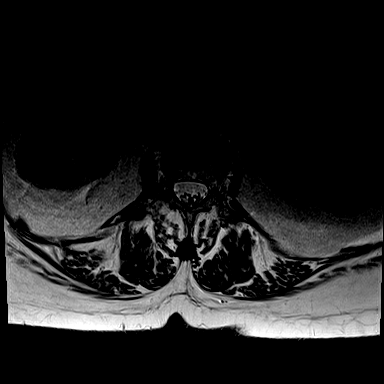
[im 31/31]
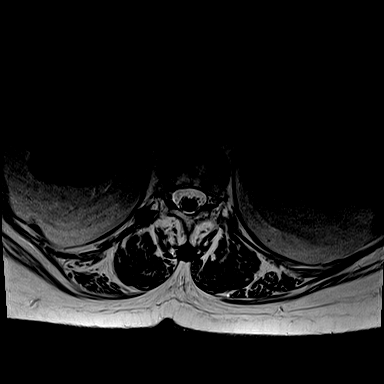

[Series 13: T1 · axial · 4.0mm · 0.34mm/px · z∈[+6,+179]mm · 5 of 31 slices shown (2 of 2)]
[im 1/31]
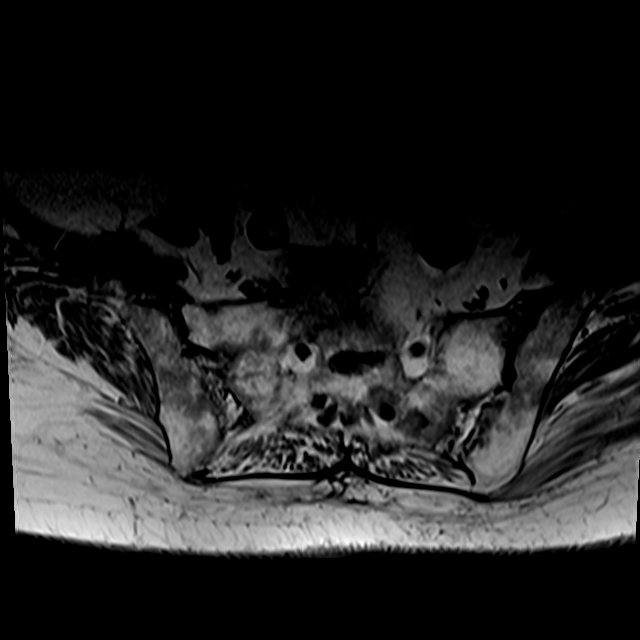
[im 5/31]
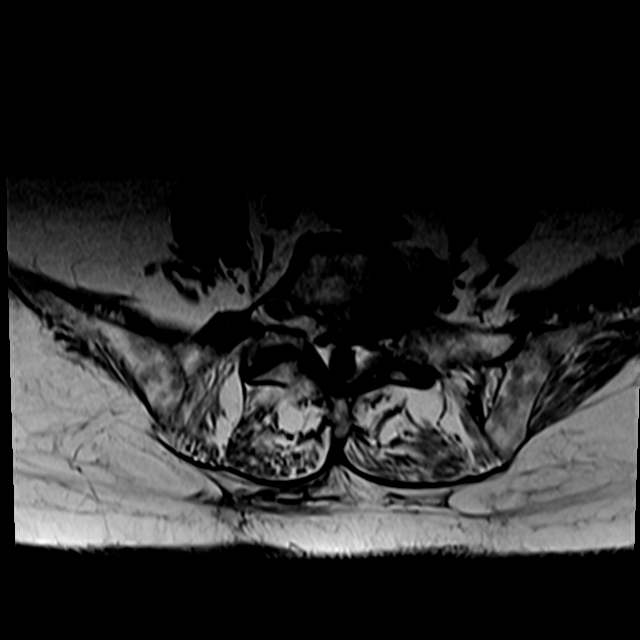
[im 10/31]
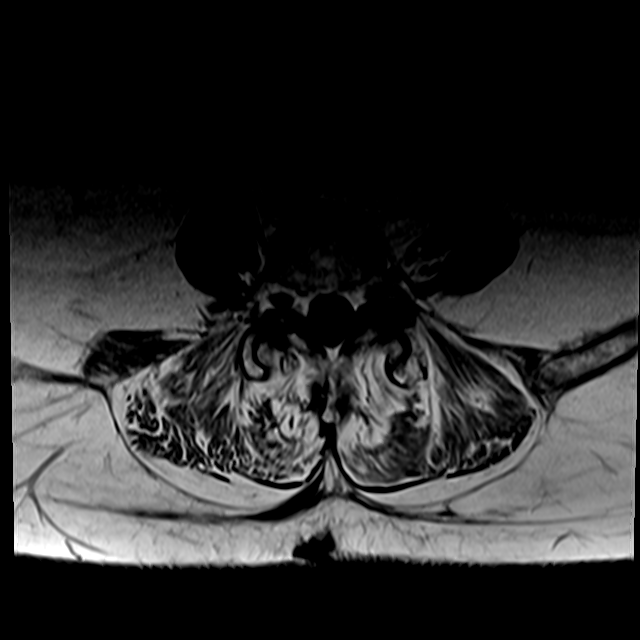
[im 17/31]
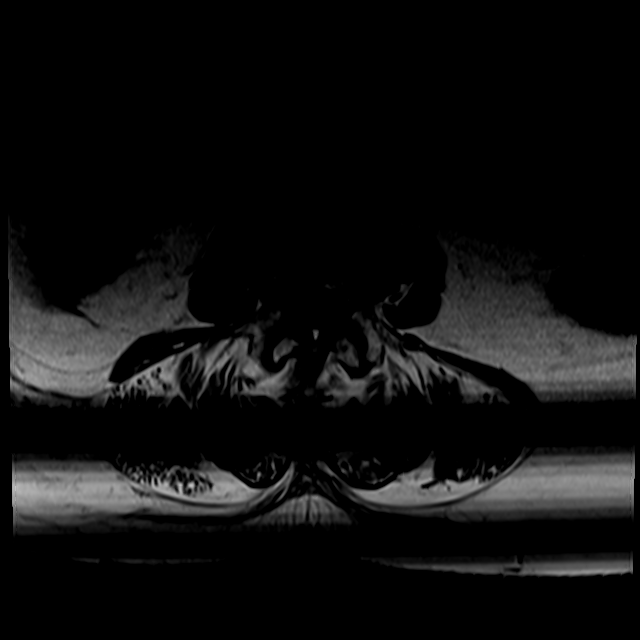
[im 26/31]
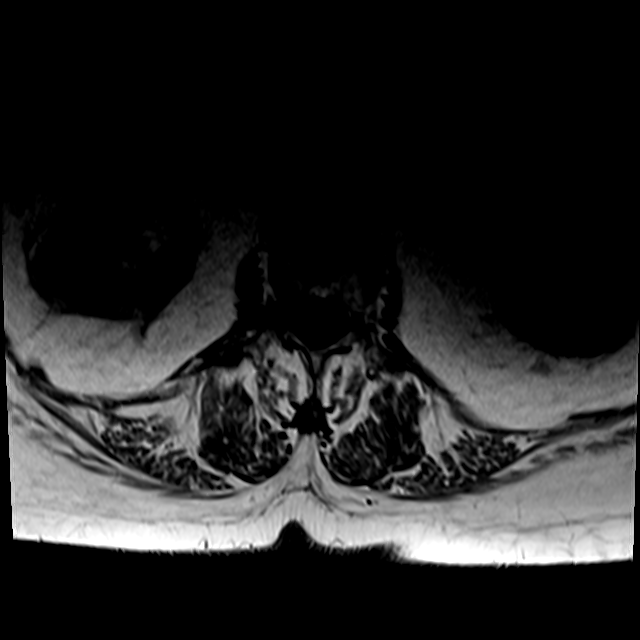

[26 of 48 positions shown; findings below may reference images not displayed]

FINDINGS: Segmentation:  5 lumbar type vertebrae

Alignment:  Grade 1 anterolisthesis at L3-4 and L4-5.

Vertebrae:  No fracture, evidence of discitis, or bone lesion.

Conus medullaris and cauda equina: Conus extends to the L1 level.
Conus and cauda equina appear normal.

Paraspinal and other soft tissues: Presacral and subcutaneous edema
without underlying osseous or muscular cause, likely third-spacing.
There is diffuse atrophy of intrinsic back muscles. Bilateral renal
cysts

Disc levels:

T12- L1: Unremarkable.

L1-L2: Mild disc narrowing and bulging.  Mild facet spurring

L2-L3: Disc narrowing and bulging. Mild facet spurring and
ligamentum flavum thickening

L3-L4: Disc narrowing and bulging. Posterior annular fissure.
Degenerative facet spurring and ligamentum flavum thickening.

L4-L5: Disc narrowing and bulging eccentric to the left. Prominent
facet osteoarthritis with spurring and anterolisthesis. Moderate
left foraminal narrowing with mild L4 root flattening. Triangular
narrowing of the thecal sac with mild stenosis of the subarticular
recesses.

L5-S1:Disc narrowing and bulging with endplate spurring. Bilateral
facet spurring. Mild bilateral foraminal stenosis.
IMPRESSION: 1. Generalized lumbar spine degeneration with L3-4 and L4-5
anterolisthesis.
2. Up to moderate foraminal narrowing on the left at L4-5.
3. Up to mild spinal stenosis at L4-5.
